# Patient Record
Sex: Female | Born: 1965 | Race: White | Hispanic: No | Marital: Married | Smoking: Never smoker
Health system: Southern US, Community
[De-identification: ages and names within clinical notes are randomized; demographics above are authoritative.]

## PROBLEM LIST (undated history)

## (undated) DIAGNOSIS — D649 Anemia, unspecified: Secondary | ICD-10-CM

## (undated) DIAGNOSIS — R519 Headache, unspecified: Secondary | ICD-10-CM

## (undated) DIAGNOSIS — E785 Hyperlipidemia, unspecified: Secondary | ICD-10-CM

## (undated) DIAGNOSIS — R51 Headache: Secondary | ICD-10-CM

## (undated) DIAGNOSIS — Z9289 Personal history of other medical treatment: Secondary | ICD-10-CM

## (undated) HISTORY — PX: WISDOM TOOTH EXTRACTION: SHX21

## (undated) HISTORY — DX: Anemia, unspecified: D64.9

---

## 2004-06-18 ENCOUNTER — Other Ambulatory Visit: Admission: RE | Admit: 2004-06-18 | Discharge: 2004-06-18 | Payer: Self-pay | Admitting: Family Medicine

## 2006-07-27 ENCOUNTER — Other Ambulatory Visit: Admission: RE | Admit: 2006-07-27 | Discharge: 2006-07-27 | Payer: Self-pay | Admitting: Family Medicine

## 2008-07-02 ENCOUNTER — Encounter: Admission: RE | Admit: 2008-07-02 | Discharge: 2008-07-02 | Payer: Self-pay | Admitting: Family Medicine

## 2010-11-03 ENCOUNTER — Other Ambulatory Visit: Payer: Self-pay | Admitting: Internal Medicine

## 2010-11-03 DIAGNOSIS — Z1231 Encounter for screening mammogram for malignant neoplasm of breast: Secondary | ICD-10-CM

## 2010-11-17 ENCOUNTER — Ambulatory Visit
Admission: RE | Admit: 2010-11-17 | Discharge: 2010-11-17 | Disposition: A | Discharge status: Home / Self Care | Payer: BC Managed Care – PPO | Source: Ambulatory Visit | Attending: Internal Medicine | Admitting: Internal Medicine

## 2010-11-17 DIAGNOSIS — Z1231 Encounter for screening mammogram for malignant neoplasm of breast: Secondary | ICD-10-CM

## 2012-02-03 ENCOUNTER — Other Ambulatory Visit: Payer: Self-pay | Admitting: Family Medicine

## 2012-02-03 ENCOUNTER — Other Ambulatory Visit (HOSPITAL_COMMUNITY)
Admission: RE | Admit: 2012-02-03 | Discharge: 2012-02-03 | Disposition: A | Discharge status: Home / Self Care | Payer: BC Managed Care – PPO | Source: Ambulatory Visit | Attending: Family Medicine | Admitting: Family Medicine

## 2012-02-03 DIAGNOSIS — Z01419 Encounter for gynecological examination (general) (routine) without abnormal findings: Secondary | ICD-10-CM | POA: Insufficient documentation

## 2014-05-30 ENCOUNTER — Other Ambulatory Visit: Payer: Self-pay | Admitting: Family Medicine

## 2014-05-30 DIAGNOSIS — Z1231 Encounter for screening mammogram for malignant neoplasm of breast: Secondary | ICD-10-CM

## 2015-11-13 ENCOUNTER — Other Ambulatory Visit: Payer: Self-pay

## 2015-11-13 DIAGNOSIS — Z1231 Encounter for screening mammogram for malignant neoplasm of breast: Secondary | ICD-10-CM

## 2015-12-31 ENCOUNTER — Ambulatory Visit
Admission: RE | Admit: 2015-12-31 | Discharge: 2015-12-31 | Disposition: A | Discharge status: Home / Self Care | Payer: Managed Care, Other (non HMO) | Source: Ambulatory Visit

## 2015-12-31 DIAGNOSIS — Z1231 Encounter for screening mammogram for malignant neoplasm of breast: Secondary | ICD-10-CM

## 2016-04-12 ENCOUNTER — Other Ambulatory Visit: Payer: Self-pay | Admitting: Family Medicine

## 2016-04-12 ENCOUNTER — Other Ambulatory Visit (HOSPITAL_COMMUNITY)
Admission: RE | Admit: 2016-04-12 | Discharge: 2016-04-12 | Disposition: A | Discharge status: Home / Self Care | Payer: Managed Care, Other (non HMO) | Source: Ambulatory Visit | Attending: Family Medicine | Admitting: Family Medicine

## 2016-04-12 DIAGNOSIS — Z01411 Encounter for gynecological examination (general) (routine) with abnormal findings: Secondary | ICD-10-CM | POA: Diagnosis not present

## 2016-04-14 LAB — CYTOLOGY - PAP

## 2017-03-02 IMAGING — MG MM SCREENING BREAST TOMO BILATERAL
8 of 12 series · 8 of 28 positions shown · non-contrast
Comparison: Previous exam(s).

CLINICAL DATA: Screening.

EXAM:
2D DIGITAL SCREENING BILATERAL MAMMOGRAM WITH CAD AND ADJUNCT TOMO

[R CC synth-2D]
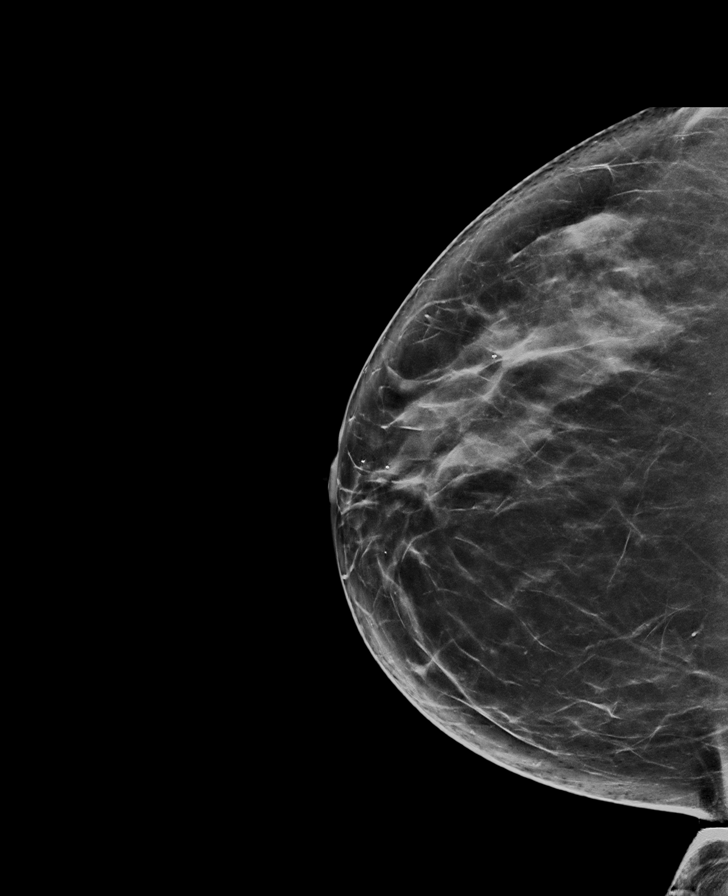

[L CC synth-2D]
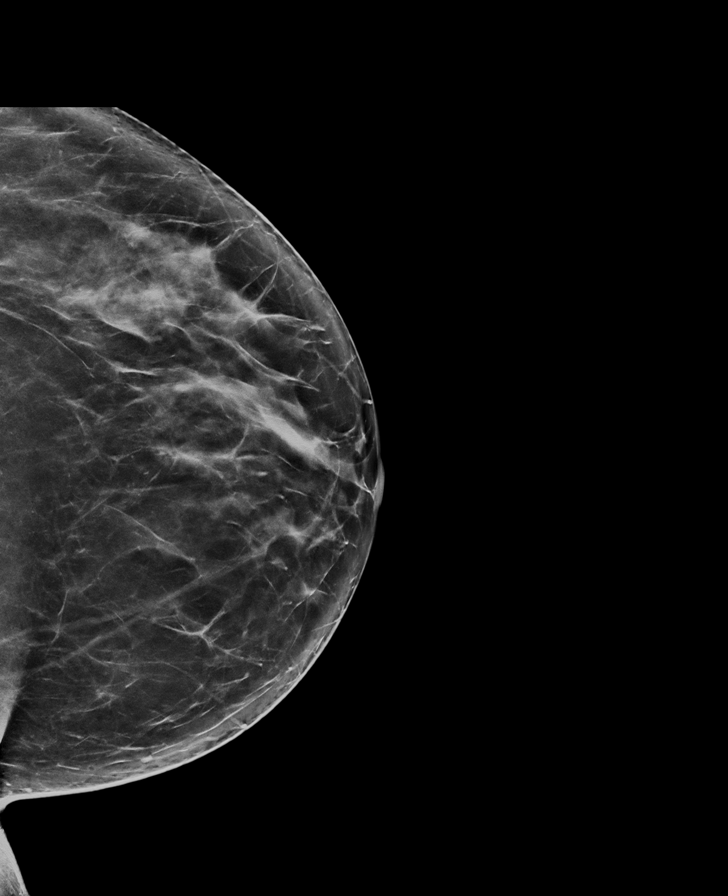

[L MLO]
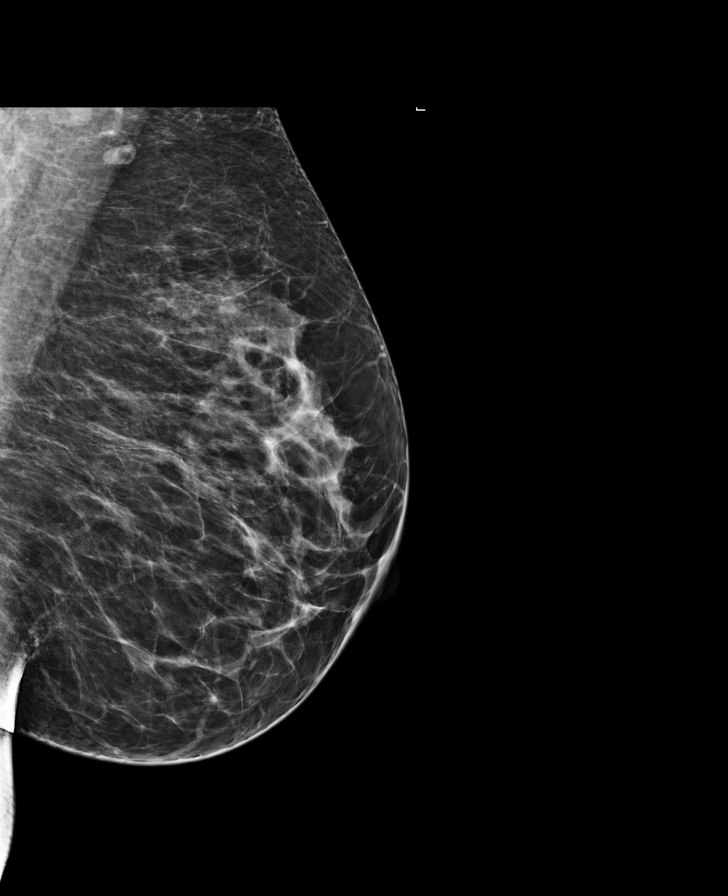

[R MLO synth-2D]
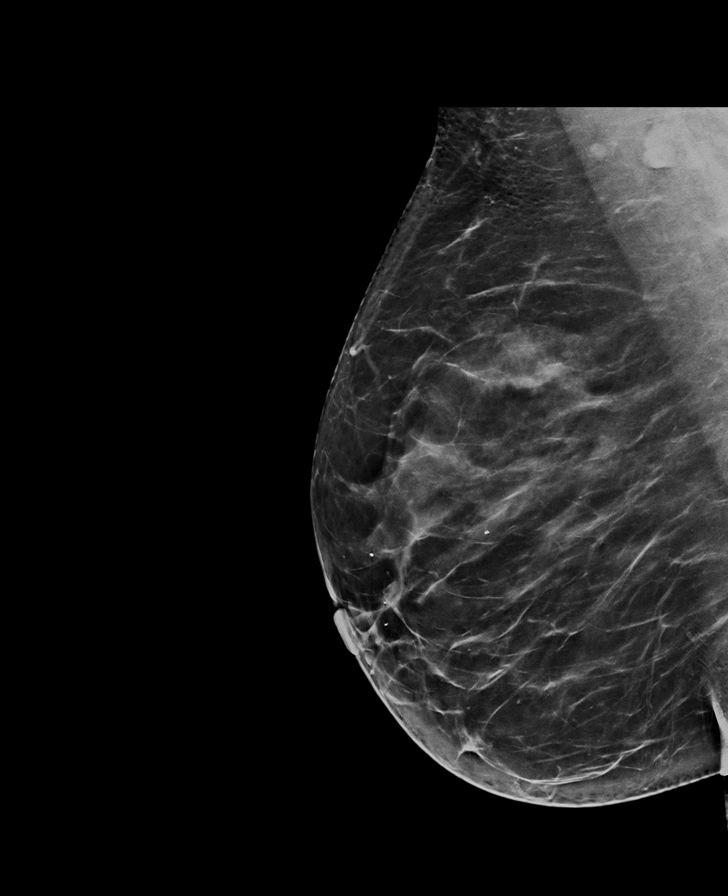

[R CC]
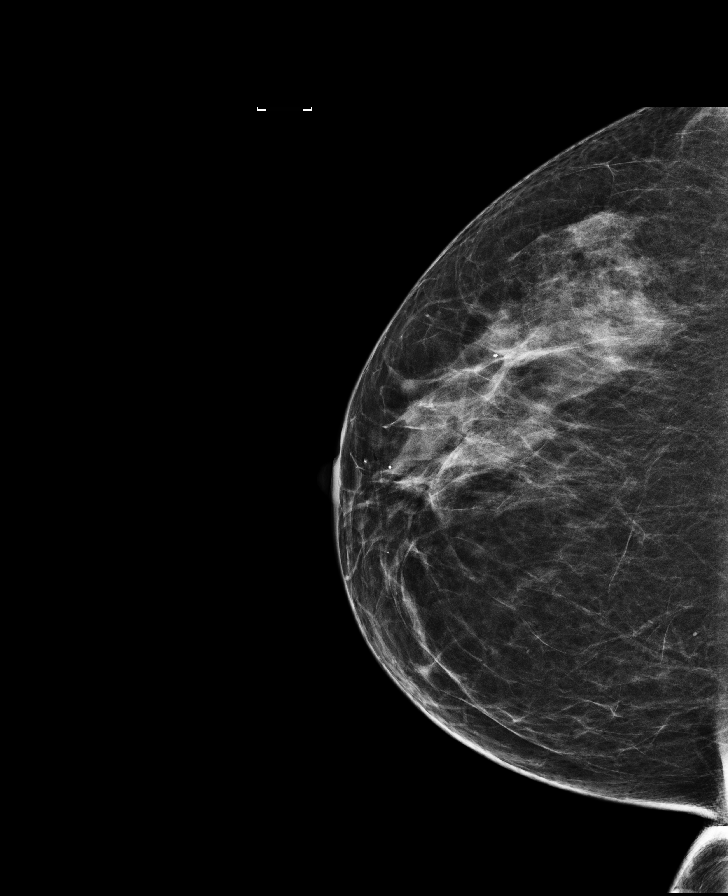

[L CC]
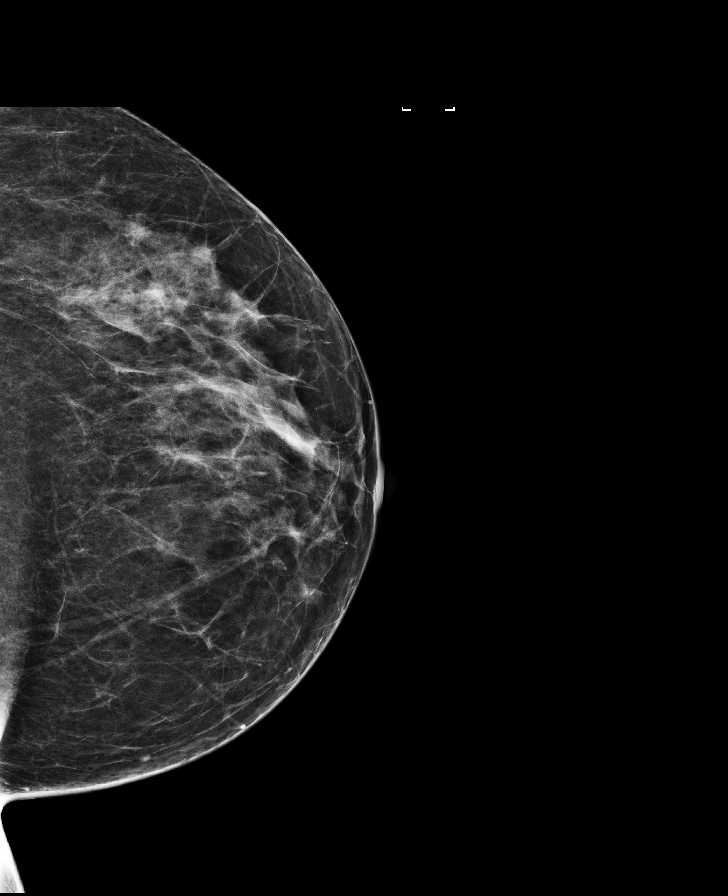

[R MLO]
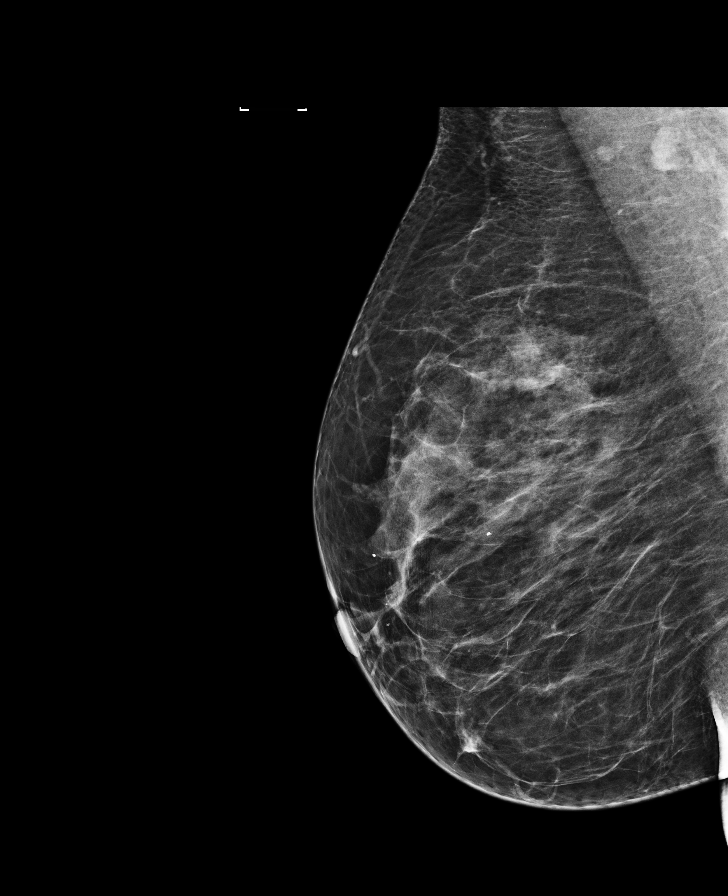

[L MLO synth-2D]
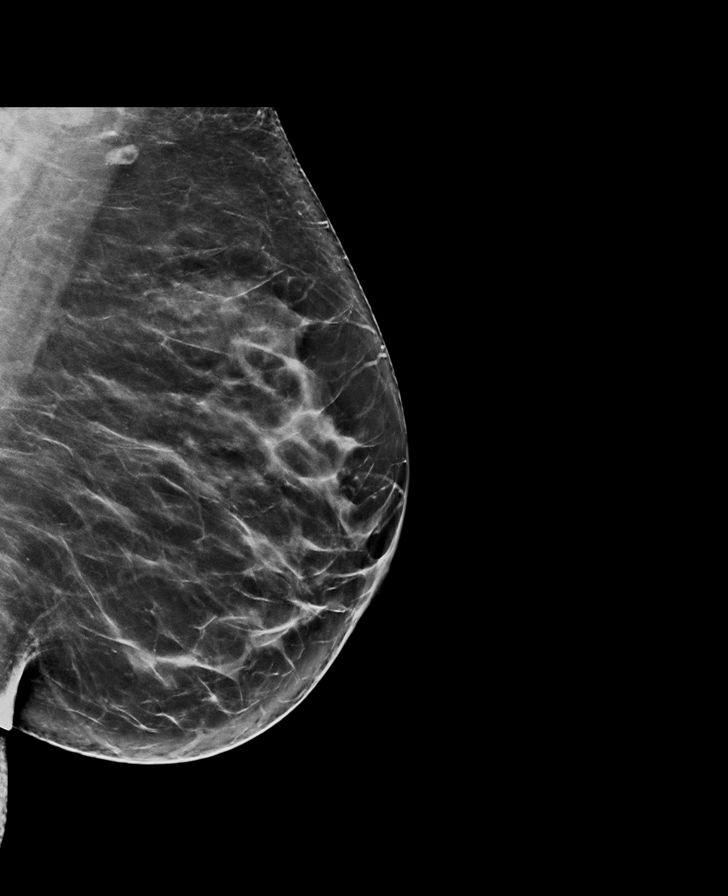

[8 of 28 positions shown; findings below may reference images not displayed]

ACR Breast Density Category b: There are scattered areas of
fibroglandular density.
FINDINGS: There are no findings suspicious for malignancy. Images were
processed with CAD.
IMPRESSION: No mammographic evidence of malignancy. A result letter of this
screening mammogram will be mailed directly to the patient.

RECOMMENDATION:
Screening mammogram in one year. (Code:97-6-RS4)

BI-RADS CATEGORY  1: Negative.

## 2018-04-28 ENCOUNTER — Observation Stay (HOSPITAL_COMMUNITY)
Admission: AD | Admit: 2018-04-28 | Discharge: 2018-04-28 | Disposition: A | Discharge status: Home / Self Care | Payer: Managed Care, Other (non HMO) | Source: Ambulatory Visit | Attending: Obstetrics and Gynecology | Admitting: Obstetrics and Gynecology

## 2018-04-28 ENCOUNTER — Other Ambulatory Visit: Payer: Self-pay

## 2018-04-28 ENCOUNTER — Encounter (HOSPITAL_COMMUNITY): Payer: Self-pay

## 2018-04-28 DIAGNOSIS — D591 Other autoimmune hemolytic anemias: Secondary | ICD-10-CM | POA: Diagnosis not present

## 2018-04-28 DIAGNOSIS — D649 Anemia, unspecified: Secondary | ICD-10-CM | POA: Diagnosis present

## 2018-04-28 DIAGNOSIS — N938 Other specified abnormal uterine and vaginal bleeding: Secondary | ICD-10-CM | POA: Diagnosis present

## 2018-04-28 DIAGNOSIS — Z9289 Personal history of other medical treatment: Secondary | ICD-10-CM

## 2018-04-28 HISTORY — DX: Personal history of other medical treatment: Z92.89

## 2018-04-28 LAB — BASIC METABOLIC PANEL
Anion gap: 11 (ref 5–15)
BUN: 10 mg/dL (ref 6–20)
CO2: 20 mmol/L — ABNORMAL LOW (ref 22–32)
Calcium: 9.2 mg/dL (ref 8.9–10.3)
Chloride: 107 mmol/L (ref 98–111)
Creatinine, Ser: 0.75 mg/dL (ref 0.44–1.00)
GFR calc Af Amer: >60 mL/min (ref >60)
GLUCOSE: 178 mg/dL — AB (ref 70–99)
POTASSIUM: 4.2 mmol/L (ref 3.5–5.1)
Sodium: 138 mmol/L (ref 135–145)

## 2018-04-28 LAB — PREPARE RBC (CROSSMATCH)

## 2018-04-28 LAB — ABO/RH: ABO/RH(D): O POS

## 2018-04-28 MED ORDER — DIPHENHYDRAMINE HCL 25 MG PO CAPS
25.0000 mg | ORAL_CAPSULE | Freq: Once | ORAL | Status: AC
  Administered 2018-04-28: 25 mg via ORAL
  Filled 2018-04-28: qty 1

## 2018-04-28 MED ORDER — ACETAMINOPHEN 325 MG PO TABS
650.0000 mg | ORAL_TABLET | Freq: Once | ORAL | Status: AC
  Administered 2018-04-28: 650 mg via ORAL
  Filled 2018-04-28: qty 2

## 2018-04-28 MED ORDER — PRENATAL MULTIVITAMIN CH: 1.0000 | ORAL_TABLET | Freq: Every day | ORAL | Status: DC

## 2018-04-28 MED ORDER — SODIUM CHLORIDE 0.9% IV SOLUTION
Freq: Once | INTRAVENOUS | Status: AC
  Administered 2018-04-28: 10:00:00 via INTRAVENOUS

## 2018-04-28 NOTE — H&P (Addendum)
Veronica Nichols is an 52 y.o. female. Presenting with anemia of ~6 after episode of heavy vaginal bleeding.  Found to have a 4cm cervical mass on exam.   Pertinent Gynecological History: Menses: flow is excessive with use of 20 pads or tampons on heaviest days Bleeding: dysfunctional uterine bleeding Contraception: none DES exposure: denies Blood transfusions: none Sexually transmitted diseases: no past history Previous GYN Procedures: none  Last mammogram: normal Date: 2016 Last pap: normal Date: 2016 OB History: G2, P2002   Menstrual History: Menarche age: n/a No LMP recorded.    History reviewed. No pertinent past medical history.  History reviewed. No pertinent surgical history.  History reviewed. No pertinent family history.  Social History:  reports that she has never smoked. She has never used smokeless tobacco. Her alcohol and drug histories are not on file.  Allergies:  Allergies  Allergen Reactions  . Sulfa Antibiotics Hives    Medications Prior to Admission  Medication Sig Dispense Refill Last Dose  . ferrous sulfate 325 (65 FE) MG tablet Take 325 mg by mouth 2 (two) times daily.   04/27/2018 at Unknown time  . norethindrone (AYGESTIN) 5 MG tablet    04/27/2018 at Unknown time    ROS  Blood pressure (!) 95/51, pulse 81, temperature 99.3 F (37.4 C), temperature source Oral, resp. rate 18, height 5\' 2"  (1.575 m), weight 69.4 kg, SpO2 98 %. Physical Exam  Gen NAD Abd soft nontender nondistended GYN b/l external vulva normal, vagina w/out masses. Cervix 5cm dilated filled with large mass. Uterus enlarged (~15 week size). Adnexa non-palapable and nontender Ext no edema b/l  Results for orders placed or performed during the hospital encounter of 04/28/18 (from the past 24 hour(s))  Basic metabolic panel     Status: Abnormal   Collection Time: 04/28/18  9:04 AM  Result Value Ref Range   Sodium 138 135 - 145 mmol/L   Potassium 4.2 3.5 - 5.1 mmol/L   Chloride 107 98  - 111 mmol/L   CO2 20 (L) 22 - 32 mmol/L   Glucose, Bld 178 (H) 70 - 99 mg/dL   BUN 10 6 - 20 mg/dL   Creatinine, Ser 0.75 0.44 - 1.00 mg/dL   Calcium 9.2 8.9 - 10.3 mg/dL   GFR calc non Af Amer >60 >60 mL/min   GFR calc Af Amer >60 >60 mL/min   Anion gap 11 5 - 15  Prepare RBC     Status: None   Collection Time: 04/28/18  9:04 AM  Result Value Ref Range   Order Confirmation      ORDER PROCESSED BY BLOOD BANK Performed at [facility], [address], [place], [place] [address]   Type and screen [place]     Status: None (Preliminary result)   Collection Time: 04/28/18  9:04 AM  Result Value Ref Range   ABO/RH(D) O POS    Antibody Screen NEG    Sample Expiration 05/01/2018    Unit Number [id number]    Blood Component Type RED CELLS,LR    Unit division 00    Status of Unit ISSUED    Transfusion Status OK TO TRANSFUSE    Crossmatch Result Compatible    Unit Number [id number]    Blood Component Type RED CELLS,LR    Unit division 00    Status of Unit ALLOCATED    Transfusion Status OK TO TRANSFUSE    Crossmatch Result Compatible    Unit Number [id number]    Blood  Component Type RED CELLS,LR    Unit division 00    Status of Unit ISSUED    Transfusion Status OK TO TRANSFUSE    Crossmatch Result      Compatible Performed at [facility], [address] [place], [place] [address]   ABO/Rh     Status: None   Collection Time: 04/28/18  9:04 AM  Result Value Ref Range   ABO/RH(D)      O POS Performed at [facility], [address] [place], [place] [address]    Imaging TVUS in office: 14x8.4x8.8cm uterus with fibroids  Subserosal: 3, 2, and 2cm  Intramural: 3 and 1.6cm  4.3cm cervical fibroid (?)  EMS 2.3cm R Ovary 3cm simple cyst L Ovary 4.8cm simple cyst Tubular structure in L adnexa (hydrosalpinx?) 4x2.3cm No results found.  Assessment/Plan: 23JS [id number] presenting with symptomatic anemia.  # Anemia - likely s/s  heavy VB from cervical mass Plan for 3u pRBC - risks discussed . Consents to transfusions. Continue Iron upon dc CBC in office tuesday  # Fibroid uterus/Cervical mass - in office biopsy of cervical mass planned. Highly suspect fibroid. EMB also planned in office. Fu pap smear.  Options reviewed if path c/w fibroid - including observation, vaginal MMY, Lupron followed by hyst, and hyst and pt opts lupron x3 months followed by hyst. Likely abdominal but hope to shrink utereus enough for minimally invasive approach  # vaginal bleeding - none current s/p heavy bleed last month soaking tampons q 15 min. On progesterone daily to keep EMS thin in preparation for surgical management  # Ovarian cysts - bilateral, both <5cm. Dw patient repeat imaging six weeks.    Veronica Nichols Veronica Nichols 04/28/2018, 3:24 PM

## 2018-04-28 NOTE — Progress Notes (Signed)
Medications discussed; Teach Back Method Used; Follow-up care reviewed; Discharge instructions reviewed; Admission discussed; Patient/caregiver verbalized understanding; Pain management discussed; Prescriptions reviewed.

## 2018-04-30 LAB — BPAM RBC
BLOOD PRODUCT EXPIRATION DATE: 201909152359
Blood Product Expiration Date: 201909152359
Blood Product Expiration Date: 201909152359
ISSUE DATE / TIME: 201908161209
ISSUE DATE / TIME: 201908161351
ISSUE DATE / TIME: 201908161533
UNIT TYPE AND RH: 5100
UNIT TYPE AND RH: 5100
Unit Type and Rh: 5100

## 2018-04-30 LAB — TYPE AND SCREEN
ABO/RH(D): O POS
Antibody Screen: NEGATIVE
UNIT DIVISION: 0
Unit division: 0
Unit division: 0

## 2018-05-01 NOTE — Discharge Summary (Signed)
Physician Discharge Summary  Patient ID: Veronica Nichols MRN: [id number] DOB/AGE: Jun 07, 1966 52 y.o.  Admit date: 04/28/2018 Discharge date: 05/01/2018  Admission Diagnoses:  Discharge Diagnoses:  Active Problems:   Symptomatic anemia   Discharged Condition: good  Hospital Course: 52 yo p/w symptomatic anemia. She received 3u prbc with repeat of hgb planned in office     Consults: None  Significant Diagnostic Studies: none   Treatments: blood  Discharge Exam: Blood pressure 121/77, pulse 79, temperature 99.2 F (37.3 C), temperature source Oral, resp. rate 18, height 5\' 2"  (1.575 m), weight 69.4 kg, SpO2 98 %. General appearance: alert, cooperative and appears stated age Resp: clear to auscultation bilaterally Cardio: regular rate and rhythm, S1, S2 normal, no murmur, click, rub or gallop GI: soft, non-tender; bowel sounds normal; no masses,  no organomegaly Pelvic: cervix normal in appearance, external genitalia normal, no adnexal masses or tenderness, no cervical motion tenderness, rectovaginal septum normal, uterus normal size, shape, and consistency and vagina normal without discharge  Disposition:   Discharge Instructions    Call MD for:   Complete by:  As directed    Heavy vaginal bleeding or abnormal vaginal discharge   Call MD for:  difficulty breathing, headache or visual disturbances   Complete by:  As directed    Call MD for:  persistant nausea and vomiting   Complete by:  As directed    Call MD for:  redness, tenderness, or signs of infection (pain, swelling, redness, odor or green/yellow discharge around incision site)   Complete by:  As directed    Call MD for:  severe uncontrolled pain   Complete by:  As directed    Call MD for:  temperature >100.4   Complete by:  As directed    Diet general   Complete by:  As directed    Driving Restrictions   Complete by:  As directed    Do not drive until you are off narcotic pain medications and you feel like you  can react in an emergency.   Increase activity slowly   Complete by:  As directed    Sexual Activity Restrictions   Complete by:  As directed    Nothing in the vagina x 2 to 6 weeks. We will discuss at clinic visit.     Allergies as of 04/28/2018      Reactions   Sulfa Antibiotics Hives      Medication List    TAKE these medications   ferrous sulfate 325 (65 FE) MG tablet Take 325 mg by mouth 2 (two) times daily.   norethindrone 5 MG tablet Commonly known as:  AYGESTIN        Signed: Tyson Dense 05/01/2018, 3:01 AM

## 2018-05-02 NOTE — Progress Notes (Signed)
Patient has never been seen here. Patient has orders from 2005 with your authorization. Patient has not established with you as an active patient at [facility].

## 2018-08-24 ENCOUNTER — Telehealth: Payer: Self-pay | Admitting: *Deleted

## 2018-08-24 NOTE — Telephone Encounter (Signed)
Attempted to contact the patient regarding her referral. Unable to leave message, voicemail full.

## 2018-08-25 ENCOUNTER — Telehealth: Payer: Self-pay | Admitting: *Deleted

## 2018-08-25 NOTE — Telephone Encounter (Signed)
Called and spoke with the patient, scheduled the new patient appt for 12/17

## 2018-08-30 NOTE — Progress Notes (Signed)
Consult Note: Gyn-Onc  Consult was requested by Dr.Leger for the evaluation of Veronica Nichols 52 y.o. female  CC: Left adnexal mass, abnormal uterine bleeding  Assessment/Plan:  Ms. Veronica Nichols is a 52 y.o. premenopausal woman with abnormal uterine bleeding, and a left adnexal mass slowly growing in size.  The left adnexal mass likely represents a benign neoplasm.  I concur with the plan for LSO.  The CA 125 is 87 but surprisingly her FSH is 4.7. She denies bloating has a great appetite and and feels well, We discussed that if intra-operative or post operative findings indicate a malignancy, that Gyn Onc  will quickly accommodate a visit in the office for treatment planning.  We also discussed that often, additional necessary Gyn Onc surgical procedure if indicated will be attempted in a minimally invasive fashion on a different operative date.Veronica Nichols has discussed definitive surgical management of her abnormal uterine bleeding with Dr. Royston Nichols and is scheduled on 10/04/2018 for TAH BS possible LO.  We would recommend collection of pelvic washings upon entry into the abdomen and palpation of the upper abdomen.  I discussed endometrial ablation or progesterone IUD but Veronica Nichols remains committed to surgical removal of the uterus.  HPI: Ms. Veronica Nichols is a 52 y.o.  With left adnexal mass, slowly increasing in size.    Pelvic ultrasound April 26, 2018 2 densities were appreciated within the endometrial cavity which were possible polyps the uterine length was 14.1 cm with endometrial thickness of 2.3 cm subserosal intramural and cervical myomas were appreciated right ovary was noted to have a 2.9 x 1.6 cm simple appearing cyst left ovary was noted to have a 4.8 x 3.6 cm simple appearing cyst and also a 4.0 x 2.3 cm elongated tubular structure  Pelvic ultrasound June 22, 2018 uterus measuring 9.62 cm with endometrial thickness of 1 cm.  Intramural and subserosal myomas identified left ovary was noted  to have a 5.2 x 3.7 x 4.7 cm cystic mass with layered internal echoes no flow seen,  Pelvic ultrasound August 15, 2018 uterus measuring 11.2 cm in length endometrial stripe 1.9 cm.  Intramural subserosal and cervical myomas were appreciated.  Right adnexa measures 2.2 x 1.5 cm was simple cyst. Left adnexa measures 7.1 x 4.3 x 5.3 cm mass seen with lower internal echoes.  Labs collected on August 14, 2018 returned as follows :Ca1 25  87, TSH 4.9 in the premenopausal range.  Inhibin A less than 1 inhibin B 152, AFP1.8  A concurrent issue is the heavy vaginal bleeding for1 year.  IN 04/2018 Hgb was 6.4 and required transfusion.  EMB indicated secretory endometrium without dysplasia. Veronica Nichols is currently on provera 5 mg daily.    Dr. Elaina Pattee plan is for total abdominal hysterectomy bilateral salpingectomy ovarian cystectomy but more likely left salpingo-oophorectomy.  The plan to which the patient has agreed is that if carcinoma is final is identified on final pathology then she will be re-counseled by GYN oncology regarding the appropriate next steps  Review of Systems:  Constitutional  Feels well Cardiovascular  No chest pain, shortness of breath, or edema  Pulmonary  No cough or wheeze.  Gastro Intestinal  No nausea, vomitting, or diarrhoea. No bright red blood per rectum, no abdominal pain, change in bowel movement, or constipation.  Genito Urinary  No frequency, urgency, dysuria, intermittent heavy vaginal bleeding.  Very heavy after last ultrasound. Musculo Skeletal  No myalgia, arthralgia, joint swelling or pain  Neurologic  No weakness, numbness, change in gait,  Psychology  No depression, anxiety, insomnia.    Current Meds:  Outpatient Encounter Medications as of 08/31/2018  Medication Sig  . ferrous sulfate 325 (65 FE) MG tablet Take 325 mg by mouth 2 (two) times daily.  . norethindrone (AYGESTIN) 5 MG tablet    No facility-administered encounter medications on file as  of 08/31/2018.     Allergy:  Allergies  Allergen Reactions  . Sulfa Antibiotics Hives    Social Hx:   Social History   Socioeconomic History  . Marital status: Married    Spouse name: Not on file  . Number of children: Not on file  . Years of education: Not on file  . Highest education level: Not on file  Occupational History  . Not on file  Social Needs  . Financial resource strain: Not on file  . Food insecurity:    Worry: Not on file    Inability: Not on file  . Transportation needs:    Medical: Not on file    Non-medical: Not on file  Tobacco Use  . Smoking status: Never Smoker  . Smokeless tobacco: Never Used  Substance and Sexual Activity  . Alcohol use: Not on file  . Drug use: Not on file  . Sexual activity: Not on file  Lifestyle  . Physical activity:    Days per week: Not on file    Minutes per session: Not on file  . Stress: Not on file  Relationships  . Social connections:    Talks on phone: Not on file    Gets together: Not on file    Attends religious service: Not on file    Active member of club or organization: Not on file    Attends meetings of clubs or organizations: Not on file    Relationship status: Not on file  . Intimate partner violence:    Fear of current or ex partner: Not on file    Emotionally abused: Not on file    Physically abused: Not on file    Forced sexual activity: Not on file  Other Topics Concern  . Not on file  Social History Narrative  . Not on file    Past Surgical Hx: No past surgical history on file.  Past Medical Hx: No past medical history on file.  Past Gynecological History: Pap August 2019- HPV high risk none detected Family Hx: MGM breast cancer dx 70's  Vitals:  Blood pressure (!) 160/86, pulse 98, temperature 97.8 F (36.6 C), temperature source Oral, resp. rate 18, height 5\' 2"  (1.575 m), weight 156 lb (70.8 kg).  Physical Exam: WD in NAD Neck  Supple NROM,  Psychiatry  Alert and oriented  appropriate mood affect speech and reasoning. Abdomen  Normoactive bowel sounds, abdomen soft, non-tender. No ascites, no palpable omental cake. No tenderness or guarding.    Back No CVA tenderness   Janie Morning, MD, PhD

## 2018-08-31 ENCOUNTER — Encounter: Payer: Self-pay | Admitting: Gynecologic Oncology

## 2018-08-31 ENCOUNTER — Inpatient Hospital Stay: Payer: Managed Care, Other (non HMO) | Attending: Gynecologic Oncology | Admitting: Gynecologic Oncology

## 2018-08-31 VITALS — BP 140/82 | HR 98 | Temp 97.8°F | Resp 18 | Ht 62.0 in | Wt 156.0 lb

## 2018-08-31 DIAGNOSIS — N9489 Other specified conditions associated with female genital organs and menstrual cycle: Secondary | ICD-10-CM

## 2018-08-31 DIAGNOSIS — N939 Abnormal uterine and vaginal bleeding, unspecified: Secondary | ICD-10-CM | POA: Diagnosis not present

## 2018-08-31 DIAGNOSIS — D398 Neoplasm of uncertain behavior of other specified female genital organs: Secondary | ICD-10-CM | POA: Diagnosis present

## 2018-08-31 NOTE — Patient Instructions (Signed)
Please call our office for any questions or concerns

## 2018-09-15 NOTE — H&P (Addendum)
Veronica Nichols is an 53 y.o. female. Presenting for scheduled surgery. She originally presented with heavy vaginal bleeding requiring blood transfusion and was found to have a large cervical mass. This was biopsied and found to be a cervical fibroid vs polyp. US showed bilateral ovarian cysts. She was placed on Lupron in hopes to shrink uterus enough for minimally invasive approach but uterus then became fixed in pelvis and ovarian mass enlarged. Tumor markers were neg except mild elevation in CA-125. GYN ONC was consulted and they felt mass was benign. She desires definitive management with hysterectomy and cystectomy vs oophorectomy of left side.   Pertinent Gynecological History: Menses: flow is excessive with use of 12 pads or tampons on heaviest days Bleeding: intermenstrual bleeding and dysfunctional uterine bleeding Contraception: none DES exposure: denies Blood transfusions: 3u pRBC 04/2018 Sexually transmitted diseases: no past history Previous GYN Procedures: biopsy of cervical mass, benign  Last mammogram: normal Date: 2016 Last pap: normal Date: 2019 OB History: G2, P2   Menstrual History: No LMP recorded.    Past Medical History:  Diagnosis Date  . Anemia     No past surgical history on file.  Family History  Problem Relation Age of Onset  . Breast cancer Mother   . Diabetes Father   . Heart disease Father     Social History:  reports that she has never smoked. She has never used smokeless tobacco. She reports current alcohol use of about 2.0 standard drinks of alcohol per week. She reports that she does not use drugs.  Allergies:   Allergies  Allergen Reactions  . Sulfa Antibiotics Hives    No medications prior to admission.    ROS  There were no vitals taken for this visit. Physical Exam  Gen: well appearing, NAD CV: Reg rate Pulm: NWOB Abd: soft, nondistended, nontender, no masses GYN: uterus 12 week size, enlarged, fibroid. Endocervical mass ~4cm  distorting cervical os, no adnexa ttp/CMT Ext: No edema b/l   No results found for this or any previous visit (from the past 24 hour(s)).  No results found.   TVUS 14 -->9cm ut w/5cm L ovarian vs tubal mass--> 11cm ut + 7cm L adnexal mass (ovarian? endometrioma?) + stable 4cm cervical fibroid. CA125 elevated    Assessment/Plan: 53 yo presenting for scheduled TAH, BS, pelvic washings, cystoscopy, and possible ovarian cystectomy vs left sided oophorectomy for hx of enlarged fibroid uterus with AUB and enlarging left sided ovarian cyst. Risks discussed including infection, bleeding, damage to surrounding structures, need for additional procedures, postoperative DVT, future prolapse, the possibility of cancer being present and requiring additional procedures, and the high likelihood of removal of at least one ovary. All questions answered. Consent signed in office. Gb improved to 12 on progesterone. Patient saw GYN ONC and they are aware of her in case any path comes back cancer.    Colin Benton  09/15/2018, 2:14 PM   No updates to above H&P. Patient arrived NPO and was consented in PACU. Risks again discussed, all questions answered, and consent signed. Proceed with above surgery.    Lucillie Garfinkel MD

## 2018-09-22 NOTE — Patient Instructions (Addendum)
Your procedure is scheduled on:  Wednesday, 10/04/2018  Enter through the Main Entrance of [facility] at: 6 am  Pick up the phone at the desk and dial [phone].  Call this number if you have problems the morning of surgery: [phone].  Remember: Do NOT eat food or Do NOT drink clear liquids (including water) after midnight Tuesday.  Take these medicines the morning of surgery with a SIP OF WATER: crestor  Brush your teeth on the day of surgery.  Stop herbal medications, vitamin supplements, Ibuprofen/NSAIDS 1 week prior to surgery- Wednesday 09/27/2018.  May use Tylenol.  Do NOT wear jewelry (body piercing), metal hair clips/bobby pins, make-up, or nail polish. Do NOT wear lotions, powders, or perfumes.  You may wear deoderant. Do NOT shave for 48 hours prior to surgery. Do NOT bring valuables to the hospital.  Leave suitcase in car.  After surgery it may be brought to your room.  For patients admitted to the hospital, checkout time is 11:00 AM the day of discharge. Have a responsible adult drive you home and stay with you for 24 hours after your procedure.  Home with Husband Veronica Nichols cell [phone].

## 2018-09-25 ENCOUNTER — Other Ambulatory Visit: Payer: Self-pay

## 2018-09-25 ENCOUNTER — Encounter (HOSPITAL_COMMUNITY): Payer: Self-pay | Admitting: *Deleted

## 2018-09-25 ENCOUNTER — Encounter (HOSPITAL_COMMUNITY)
Admission: RE | Admit: 2018-09-25 | Discharge: 2018-09-25 | Disposition: A | Discharge status: Home / Self Care | Payer: 59 | Source: Ambulatory Visit | Attending: Obstetrics and Gynecology | Admitting: Obstetrics and Gynecology

## 2018-09-25 DIAGNOSIS — Z01812 Encounter for preprocedural laboratory examination: Secondary | ICD-10-CM | POA: Diagnosis present

## 2018-09-25 HISTORY — DX: Personal history of other medical treatment: Z92.89

## 2018-09-25 HISTORY — DX: Headache: R51

## 2018-09-25 HISTORY — DX: Hyperlipidemia, unspecified: E78.5

## 2018-09-25 HISTORY — DX: Headache, unspecified: R51.9

## 2018-09-25 LAB — CBC
HCT: 38.8 % (ref 36.0–46.0)
Hemoglobin: 12.8 g/dL (ref 12.0–15.0)
MCH: 31.7 pg (ref 26.0–34.0)
MCHC: 33 g/dL (ref 30.0–36.0)
MCV: 96 fL (ref 80.0–100.0)
Platelets: 298 10*3/uL (ref 150–400)
RBC: 4.04 MIL/uL (ref 3.87–5.11)
RDW: 12.8 % (ref 11.5–15.5)
WBC: 9.4 10*3/uL (ref 4.0–10.5)
nRBC: 0 % (ref 0.0–0.2)

## 2018-09-25 LAB — TYPE AND SCREEN
ABO/RH(D): O POS
ANTIBODY SCREEN: NEGATIVE

## 2018-09-25 NOTE — Pre-Procedure Instructions (Signed)
SDS BB History Log given to lab for patient's previous blood transfusion in 04/2018 at [facility].

## 2018-09-29 NOTE — Anesthesia Preprocedure Evaluation (Addendum)
Anesthesia Evaluation  Patient identified by MRN, date of birth, ID band Patient awake    Reviewed: Allergy & Precautions, NPO status , Patient's Chart, lab work & pertinent test results  History of Anesthesia Complications Negative for: history of anesthetic complications  Airway Mallampati: II  TM Distance: >3 FB Neck ROM: Full    Dental  (+) Teeth Intact, Dental Advisory Given   Pulmonary neg pulmonary ROS,    Pulmonary exam normal breath sounds clear to auscultation       Cardiovascular negative cardio ROS Normal cardiovascular exam Rhythm:Regular Rate:Normal     Neuro/Psych negative neurological ROS     GI/Hepatic negative GI ROS, Neg liver ROS,   Endo/Other  negative endocrine ROS  Renal/GU negative Renal ROS     Musculoskeletal negative musculoskeletal ROS (+)   Abdominal   Peds  Hematology negative hematology ROS (+)   Anesthesia Other Findings Day of surgery medications reviewed with the patient.  Reproductive/Obstetrics Uterine fibroids, left ovarian mass                            Anesthesia Physical Anesthesia Plan  ASA: II  Anesthesia Plan: General   Post-op Pain Management:    Induction: Intravenous  PONV Risk Score and Plan: Treatment may vary due to age or medical condition, Ondansetron, Dexamethasone and Midazolam  Airway Management Planned: Oral ETT  Additional Equipment:   Intra-op Plan:   Post-operative Plan: Extubation in OR  Informed Consent: I have reviewed the patients History and Physical, chart, labs and discussed the procedure including the risks, benefits and alternatives for the proposed anesthesia with the patient or authorized representative who has indicated his/her understanding and acceptance.     Dental advisory given  Plan Discussed with: CRNA  Anesthesia Plan Comments:        Anesthesia Quick Evaluation

## 2018-10-04 ENCOUNTER — Encounter (HOSPITAL_COMMUNITY): Payer: Self-pay

## 2018-10-04 ENCOUNTER — Inpatient Hospital Stay (HOSPITAL_COMMUNITY): Payer: No Typology Code available for payment source | Admitting: Anesthesiology

## 2018-10-04 ENCOUNTER — Encounter (HOSPITAL_COMMUNITY)
Admission: RE | Disposition: A | Discharge status: Home / Self Care | Payer: Self-pay | Source: Home / Self Care | Attending: Obstetrics and Gynecology

## 2018-10-04 ENCOUNTER — Other Ambulatory Visit: Payer: Self-pay

## 2018-10-04 ENCOUNTER — Inpatient Hospital Stay (HOSPITAL_COMMUNITY)
Admission: RE | Admit: 2018-10-04 | Discharge: 2018-10-06 | DRG: 743 | Disposition: A | Discharge status: Home / Self Care | Payer: No Typology Code available for payment source | Attending: Obstetrics and Gynecology | Admitting: Obstetrics and Gynecology

## 2018-10-04 DIAGNOSIS — N841 Polyp of cervix uteri: Secondary | ICD-10-CM | POA: Diagnosis present

## 2018-10-04 DIAGNOSIS — D259 Leiomyoma of uterus, unspecified: Secondary | ICD-10-CM | POA: Diagnosis present

## 2018-10-04 DIAGNOSIS — N736 Female pelvic peritoneal adhesions (postinfective): Secondary | ICD-10-CM | POA: Diagnosis present

## 2018-10-04 DIAGNOSIS — D649 Anemia, unspecified: Secondary | ICD-10-CM | POA: Diagnosis present

## 2018-10-04 DIAGNOSIS — N801 Endometriosis of ovary: Secondary | ICD-10-CM | POA: Diagnosis present

## 2018-10-04 DIAGNOSIS — N939 Abnormal uterine and vaginal bleeding, unspecified: Secondary | ICD-10-CM | POA: Diagnosis present

## 2018-10-04 DIAGNOSIS — Z90710 Acquired absence of both cervix and uterus: Secondary | ICD-10-CM | POA: Diagnosis present

## 2018-10-04 HISTORY — PX: ABDOMINAL HYSTERECTOMY: SHX81

## 2018-10-04 HISTORY — PX: CYSTOSCOPY: SHX5120

## 2018-10-04 LAB — PREGNANCY, URINE: Preg Test, Ur: NEGATIVE

## 2018-10-04 SURGERY — HYSTERECTOMY, ABDOMINAL
Anesthesia: General

## 2018-10-04 MED ORDER — PROPOFOL 10 MG/ML IV BOLUS
INTRAVENOUS | Status: AC
  Filled 2018-10-04: qty 20

## 2018-10-04 MED ORDER — HYDROMORPHONE HCL 1 MG/ML IJ SOLN
0.2500 mg | INTRAMUSCULAR | Status: DC | PRN
  Administered 2018-10-04 (×3): 0.5 mg via INTRAVENOUS

## 2018-10-04 MED ORDER — DEXAMETHASONE SODIUM PHOSPHATE 10 MG/ML IJ SOLN
INTRAMUSCULAR | Status: DC | PRN
  Administered 2018-10-04: 10 mg via INTRAVENOUS

## 2018-10-04 MED ORDER — PROPOFOL 10 MG/ML IV BOLUS
INTRAVENOUS | Status: DC | PRN
  Administered 2018-10-04: 150 mg via INTRAVENOUS

## 2018-10-04 MED ORDER — KETOROLAC TROMETHAMINE 30 MG/ML IJ SOLN
15.0000 mg | Freq: Four times a day (QID) | INTRAMUSCULAR | Status: AC
  Administered 2018-10-04 – 2018-10-05 (×3): 15 mg via INTRAVENOUS
  Filled 2018-10-04 (×3): qty 1

## 2018-10-04 MED ORDER — SENNOSIDES-DOCUSATE SODIUM 8.6-50 MG PO TABS: 1.0000 | ORAL_TABLET | Freq: Every evening | ORAL | Status: DC | PRN

## 2018-10-04 MED ORDER — OXYCODONE HCL 5 MG PO TABS: 5.0000 mg | ORAL_TABLET | Freq: Once | ORAL | Status: DC | PRN

## 2018-10-04 MED ORDER — HYDROMORPHONE HCL 1 MG/ML IJ SOLN
0.2000 mg | INTRAMUSCULAR | Status: DC | PRN
  Administered 2018-10-04: 0.5 mg via INTRAVENOUS
  Administered 2018-10-04: 0.6 mg via INTRAVENOUS
  Filled 2018-10-04 (×2): qty 1

## 2018-10-04 MED ORDER — HYDROMORPHONE HCL 1 MG/ML IJ SOLN
INTRAMUSCULAR | Status: AC
  Filled 2018-10-04: qty 1

## 2018-10-04 MED ORDER — HYDROMORPHONE HCL 1 MG/ML IJ SOLN
INTRAMUSCULAR | Status: AC
  Administered 2018-10-04: 0.5 mg via INTRAVENOUS
  Filled 2018-10-04: qty 0.5

## 2018-10-04 MED ORDER — MIDAZOLAM HCL 2 MG/2ML IJ SOLN
INTRAMUSCULAR | Status: AC
  Filled 2018-10-04: qty 2

## 2018-10-04 MED ORDER — LIDOCAINE HCL (CARDIAC) PF 100 MG/5ML IV SOSY
PREFILLED_SYRINGE | INTRAVENOUS | Status: AC
  Filled 2018-10-04: qty 5

## 2018-10-04 MED ORDER — TERBUTALINE SULFATE 1 MG/ML IJ SOLN
INTRAMUSCULAR | Status: AC
  Filled 2018-10-04: qty 1

## 2018-10-04 MED ORDER — OXYCODONE HCL 5 MG/5ML PO SOLN: 5.0000 mg | Freq: Once | ORAL | Status: DC | PRN

## 2018-10-04 MED ORDER — LIDOCAINE HCL (CARDIAC) PF 100 MG/5ML IV SOSY
PREFILLED_SYRINGE | INTRAVENOUS | Status: DC | PRN
  Administered 2018-10-04: 100 mg via INTRAVENOUS

## 2018-10-04 MED ORDER — SCOPOLAMINE 1 MG/3DAYS TD PT72
1.0000 | MEDICATED_PATCH | Freq: Once | TRANSDERMAL | Status: DC
  Administered 2018-10-04: 1.5 mg via TRANSDERMAL

## 2018-10-04 MED ORDER — GLYCOPYRROLATE 0.2 MG/ML IJ SOLN
INTRAMUSCULAR | Status: DC | PRN
  Administered 2018-10-04: 0.1 mg via INTRAVENOUS

## 2018-10-04 MED ORDER — ROCURONIUM BROMIDE 100 MG/10ML IV SOLN
INTRAVENOUS | Status: DC | PRN
  Administered 2018-10-04: 10 mg via INTRAVENOUS
  Administered 2018-10-04: 5 mg via INTRAVENOUS
  Administered 2018-10-04: 50 mg via INTRAVENOUS

## 2018-10-04 MED ORDER — FENTANYL CITRATE (PF) 100 MCG/2ML IJ SOLN
INTRAMUSCULAR | Status: DC | PRN
  Administered 2018-10-04: 100 ug via INTRAVENOUS
  Administered 2018-10-04 (×5): 50 ug via INTRAVENOUS

## 2018-10-04 MED ORDER — ONDANSETRON HCL 4 MG/2ML IJ SOLN: 4.0000 mg | Freq: Four times a day (QID) | INTRAMUSCULAR | Status: DC | PRN

## 2018-10-04 MED ORDER — SIMETHICONE 80 MG PO CHEW: 80.0000 mg | CHEWABLE_TABLET | Freq: Four times a day (QID) | ORAL | Status: DC | PRN

## 2018-10-04 MED ORDER — METOCLOPRAMIDE HCL 5 MG/ML IJ SOLN
INTRAMUSCULAR | Status: AC
  Filled 2018-10-04: qty 2

## 2018-10-04 MED ORDER — ACETAMINOPHEN 10 MG/ML IV SOLN: 1000.0000 mg | Freq: Once | INTRAVENOUS | Status: DC | PRN

## 2018-10-04 MED ORDER — IBUPROFEN 800 MG PO TABS
800.0000 mg | ORAL_TABLET | Freq: Four times a day (QID) | ORAL | Status: DC
  Administered 2018-10-05 – 2018-10-06 (×3): 800 mg via ORAL
  Filled 2018-10-04 (×3): qty 1

## 2018-10-04 MED ORDER — PROMETHAZINE HCL 25 MG/ML IJ SOLN: 6.2500 mg | INTRAMUSCULAR | Status: DC | PRN

## 2018-10-04 MED ORDER — DEXAMETHASONE SODIUM PHOSPHATE 10 MG/ML IJ SOLN
INTRAMUSCULAR | Status: AC
  Filled 2018-10-04: qty 1

## 2018-10-04 MED ORDER — LACTATED RINGERS IV SOLN
INTRAVENOUS | Status: DC
  Administered 2018-10-04: 08:00:00 via INTRAVENOUS
  Administered 2018-10-04: 125 mL/h via INTRAVENOUS
  Administered 2018-10-04: 09:00:00 via INTRAVENOUS

## 2018-10-04 MED ORDER — MIDAZOLAM HCL 2 MG/2ML IJ SOLN
INTRAMUSCULAR | Status: DC | PRN
  Administered 2018-10-04: 1 mg via INTRAVENOUS

## 2018-10-04 MED ORDER — BUPIVACAINE HCL (PF) 0.5 % IJ SOLN
INTRAMUSCULAR | Status: AC
  Filled 2018-10-04: qty 30

## 2018-10-04 MED ORDER — ACETAMINOPHEN 500 MG PO TABS
1000.0000 mg | ORAL_TABLET | Freq: Four times a day (QID) | ORAL | Status: DC
  Administered 2018-10-04 – 2018-10-06 (×6): 1000 mg via ORAL
  Filled 2018-10-04 (×7): qty 2

## 2018-10-04 MED ORDER — FENTANYL CITRATE (PF) 100 MCG/2ML IJ SOLN
INTRAMUSCULAR | Status: AC
  Filled 2018-10-04: qty 2

## 2018-10-04 MED ORDER — METOCLOPRAMIDE HCL 5 MG/ML IJ SOLN
INTRAMUSCULAR | Status: DC | PRN
  Administered 2018-10-04: 10 mg via INTRAVENOUS

## 2018-10-04 MED ORDER — CEFAZOLIN SODIUM-DEXTROSE 2-4 GM/100ML-% IV SOLN
2.0000 g | INTRAVENOUS | Status: AC
  Administered 2018-10-04: 2 g via INTRAVENOUS

## 2018-10-04 MED ORDER — STERILE WATER FOR IRRIGATION IR SOLN
Status: DC | PRN
  Administered 2018-10-04: 1000 mL via INTRAVESICAL

## 2018-10-04 MED ORDER — ONDANSETRON HCL 4 MG PO TABS: 4.0000 mg | ORAL_TABLET | Freq: Four times a day (QID) | ORAL | Status: DC | PRN

## 2018-10-04 MED ORDER — 0.9 % SODIUM CHLORIDE (POUR BTL) OPTIME
TOPICAL | Status: DC | PRN
  Administered 2018-10-04: 2000 mL

## 2018-10-04 MED ORDER — CEFAZOLIN SODIUM-DEXTROSE 2-4 GM/100ML-% IV SOLN
INTRAVENOUS | Status: AC
  Filled 2018-10-04: qty 100

## 2018-10-04 MED ORDER — FERROUS SULFATE 325 (65 FE) MG PO TABS
325.0000 mg | ORAL_TABLET | Freq: Two times a day (BID) | ORAL | Status: DC
  Administered 2018-10-04 – 2018-10-06 (×4): 325 mg via ORAL
  Filled 2018-10-04 (×4): qty 1

## 2018-10-04 MED ORDER — FENTANYL CITRATE (PF) 250 MCG/5ML IJ SOLN
INTRAMUSCULAR | Status: AC
  Filled 2018-10-04: qty 5

## 2018-10-04 MED ORDER — OXYCODONE HCL 5 MG PO TABS
5.0000 mg | ORAL_TABLET | ORAL | Status: DC | PRN
  Administered 2018-10-05 – 2018-10-06 (×2): 5 mg via ORAL
  Filled 2018-10-04 (×2): qty 1

## 2018-10-04 MED ORDER — ROCURONIUM BROMIDE 100 MG/10ML IV SOLN
INTRAVENOUS | Status: AC
  Filled 2018-10-04: qty 1

## 2018-10-04 MED ORDER — SUGAMMADEX SODIUM 200 MG/2ML IV SOLN
INTRAVENOUS | Status: AC
  Filled 2018-10-04: qty 2

## 2018-10-04 MED ORDER — BUPIVACAINE HCL (PF) 0.5 % IJ SOLN
INTRAMUSCULAR | Status: DC | PRN
  Administered 2018-10-04: 10 mL

## 2018-10-04 MED ORDER — SCOPOLAMINE 1 MG/3DAYS TD PT72
MEDICATED_PATCH | TRANSDERMAL | Status: AC
  Administered 2018-10-04: 1.5 mg via TRANSDERMAL
  Filled 2018-10-04: qty 1

## 2018-10-04 MED ORDER — PANTOPRAZOLE SODIUM 40 MG PO TBEC
40.0000 mg | DELAYED_RELEASE_TABLET | Freq: Every day | ORAL | Status: DC
  Administered 2018-10-05 – 2018-10-06 (×2): 40 mg via ORAL
  Filled 2018-10-04 (×2): qty 1

## 2018-10-04 MED ORDER — SUGAMMADEX SODIUM 200 MG/2ML IV SOLN
INTRAVENOUS | Status: DC | PRN
  Administered 2018-10-04: 140 mg via INTRAVENOUS

## 2018-10-04 MED ORDER — ONDANSETRON HCL 4 MG/2ML IJ SOLN
INTRAMUSCULAR | Status: AC
  Filled 2018-10-04: qty 2

## 2018-10-04 MED ORDER — ONDANSETRON HCL 4 MG/2ML IJ SOLN
INTRAMUSCULAR | Status: DC | PRN
  Administered 2018-10-04: 4 mg via INTRAVENOUS

## 2018-10-04 MED ORDER — HYDROMORPHONE HCL 1 MG/ML IJ SOLN
INTRAMUSCULAR | Status: DC | PRN
  Administered 2018-10-04: 0.5 mg via INTRAVENOUS

## 2018-10-04 MED ORDER — HYDROMORPHONE HCL 1 MG/ML IJ SOLN
INTRAMUSCULAR | Status: AC
  Filled 2018-10-04: qty 0.5

## 2018-10-04 MED ORDER — DEXTROSE-NACL 5-0.45 % IV SOLN
125.0000 mL/h | INTRAVENOUS | Status: DC
  Administered 2018-10-04 – 2018-10-05 (×4): 125 mL/h via INTRAVENOUS

## 2018-10-04 MED ORDER — DOCUSATE SODIUM 100 MG PO CAPS
100.0000 mg | ORAL_CAPSULE | Freq: Two times a day (BID) | ORAL | Status: DC
  Administered 2018-10-04 – 2018-10-06 (×4): 100 mg via ORAL
  Filled 2018-10-04 (×4): qty 1

## 2018-10-04 MED ORDER — ALUM & MAG HYDROXIDE-SIMETH 200-200-20 MG/5ML PO SUSP: 30.0000 mL | ORAL | Status: DC | PRN

## 2018-10-04 SURGICAL SUPPLY — 41 items
ADH SKN CLS APL DERMABOND .7 (GAUZE/BANDAGES/DRESSINGS) ×2
APL SKNCLS STERI-STRIP NONHPOA (GAUZE/BANDAGES/DRESSINGS) ×2
BENZOIN TINCTURE PRP APPL 2/3 (GAUZE/BANDAGES/DRESSINGS) ×1 IMPLANT
CANISTER SUCT 3000ML PPV (MISCELLANEOUS) ×3 IMPLANT
DERMABOND ADVANCED (GAUZE/BANDAGES/DRESSINGS) ×1
DERMABOND ADVANCED .7 DNX12 (GAUZE/BANDAGES/DRESSINGS) ×2 IMPLANT
DRAPE WARM FLUID 44X44 (DRAPE) ×1 IMPLANT
DRSG OPSITE POSTOP 4X10 (GAUZE/BANDAGES/DRESSINGS) ×3 IMPLANT
DURAPREP 26ML APPLICATOR (WOUND CARE) ×3 IMPLANT
GAUZE 4X4 16PLY RFD (DISPOSABLE) ×1 IMPLANT
GAUZE SPONGE 4X4 12PLY STRL (GAUZE/BANDAGES/DRESSINGS) ×2 IMPLANT
GLOVE BIO SURGEON STRL SZ 6.5 (GLOVE) ×3 IMPLANT
GLOVE BIOGEL PI IND STRL 6.5 (GLOVE) ×2 IMPLANT
GLOVE BIOGEL PI IND STRL 7.0 (GLOVE) ×4 IMPLANT
GLOVE BIOGEL PI INDICATOR 6.5 (GLOVE) ×1
GLOVE BIOGEL PI INDICATOR 7.0 (GLOVE) ×2
GOWN STRL REUS W/TWL LRG LVL3 (GOWN DISPOSABLE) ×9 IMPLANT
HEMOSTAT ARISTA ABSORB 3G PWDR (HEMOSTASIS) ×1 IMPLANT
HIBICLENS CHG 4% 4OZ BTL (MISCELLANEOUS) ×3 IMPLANT
LIGASURE IMPACT 36 18CM CVD LR (INSTRUMENTS) IMPLANT
NDL HYPO 25X1 1.5 SAFETY (NEEDLE) IMPLANT
NEEDLE HYPO 25X1 1.5 SAFETY (NEEDLE) ×3 IMPLANT
NS IRRIG 1000ML POUR BTL (IV SOLUTION) ×3 IMPLANT
PACK ABDOMINAL GYN (CUSTOM PROCEDURE TRAY) ×3 IMPLANT
PAD ABD 8X7 1/2 STERILE (GAUZE/BANDAGES/DRESSINGS) ×2 IMPLANT
PAD OB MATERNITY 4.3X12.25 (PERSONAL CARE ITEMS) ×3 IMPLANT
SET CYSTO W/LG BORE CLAMP LF (SET/KITS/TRAYS/PACK) ×1 IMPLANT
SHEET LAVH (DRAPES) ×3 IMPLANT
SPONGE LAP 18X18 RF (DISPOSABLE) ×2 IMPLANT
SPONGE SURGIFOAM ABS GEL 12-7 (HEMOSTASIS) ×1 IMPLANT
STRIP CLOSURE SKIN 1/2X4 (GAUZE/BANDAGES/DRESSINGS) ×1 IMPLANT
SUT PLAIN 2 0 (SUTURE) ×3
SUT PLAIN ABS 2-0 CT1 27XMFL (SUTURE) ×2 IMPLANT
SUT VIC AB 0 CT1 18XCR BRD8 (SUTURE) ×4 IMPLANT
SUT VIC AB 0 CT1 36 (SUTURE) ×9 IMPLANT
SUT VIC AB 0 CT1 8-18 (SUTURE) ×9
SUT VICRYL 0 TIES 12 18 (SUTURE) ×3 IMPLANT
SUT VICRYL 4-0 PS2 18IN ABS (SUTURE) ×3 IMPLANT
SYR CONTROL 10ML LL (SYRINGE) ×1 IMPLANT
TOWEL OR 17X24 6PK STRL BLUE (TOWEL DISPOSABLE) ×6 IMPLANT
TRAY FOLEY W/BAG SLVR 14FR (SET/KITS/TRAYS/PACK) ×3 IMPLANT

## 2018-10-04 NOTE — Anesthesia Postprocedure Evaluation (Signed)
Anesthesia Post Note  Patient: Veronica Nichols  Procedure(s) Performed: TOTAL ABDOMINAL HYSTERECTOMY, BILATERAL SALPINGECTOMY, PELVIC WASHINGS, LEFT OVARIAN CYSTECTOMY CYSTOSCOPY (N/A )     Patient location during evaluation: PACU Anesthesia Type: General Level of consciousness: awake and alert Pain management: pain level controlled Vital Signs Assessment: post-procedure vital signs reviewed and stable Respiratory status: spontaneous breathing, nonlabored ventilation and respiratory function stable Cardiovascular status: blood pressure returned to baseline and stable Postop Assessment: no apparent nausea or vomiting Anesthetic complications: no    Last Vitals:  Vitals:   10/04/18 1115 10/04/18 1134  BP: 120/86 137/74  Pulse: 93 94  Resp: (!) 8 18  Temp:  36.8 C  SpO2: 100% 100%    Last Pain:  Vitals:   10/04/18 1134  TempSrc: Oral  PainSc:    Pain Goal: Patients Stated Pain Goal: 3 (10/04/18 0616)                 Brennan Bailey

## 2018-10-04 NOTE — Anesthesia Procedure Notes (Signed)
Procedure Name: Intubation Date/Time: 10/04/2018 7:31 AM Performed by: Flossie Dibble, CRNA Pre-anesthesia Checklist: Patient identified, Patient being monitored, Timeout performed, Emergency Drugs available and Suction available Patient Re-evaluated:Patient Re-evaluated prior to induction Oxygen Delivery Method: Circle System Utilized Preoxygenation: Pre-oxygenation with 100% oxygen Induction Type: IV induction Ventilation: Mask ventilation without difficulty and Oral airway inserted - appropriate to patient size Laryngoscope Size: Mac and 3 Grade View: Grade II Tube type: Oral Tube size: 7.0 mm Number of attempts: 1 Airway Equipment and Method: stylet Placement Confirmation: ETT inserted through vocal cords under direct vision,  positive ETCO2 and breath sounds checked- equal and bilateral Secured at: 20 cm Tube secured with: Tape Dental Injury: Teeth and Oropharynx as per pre-operative assessment

## 2018-10-04 NOTE — Brief Op Note (Signed)
10/04/2018  10:46 AM  PATIENT:  Veronica Nichols  53 y.o. female  PRE-OPERATIVE DIAGNOSIS:  AUB, fibroids, left ovarian mass  POST-OPERATIVE DIAGNOSIS:  AUB, fibroids, left ovarian mass  PROCEDURE:  Procedure(s): TOTAL ABDOMINAL HYSTERECTOMY, BILATERAL SALPINGECTOMY, PELVIC WASHINGS, LEFT OVARIAN CYSTECTOMY CYSTOSCOPY (N/A)  SURGEON:  Surgeon(s) and Role:    * Isolde Skaff, Colin Benton, MD - Primary    * Arvella Nigh, MD - Assisting  PHYSICIAN ASSISTANT:  McComb  ASSISTANTS: McComb   ANESTHESIA:   local, general and 10cc bupivicaine intraabdominal  EBL:  700 mL   BLOOD ADMINISTERED:none  DRAINS: Urinary Catheter (Foley)   LOCAL MEDICATIONS USED:  BUPIVICAINE  and Amount: 10 ml  SPECIMEN:  Source of Specimen:  uterus with cervix, bilateral fallopian tubes, portion of left ovarian cyst with cyst fluid, and pelvic washings  DISPOSITION OF SPECIMEN:  PATHOLOGY  COUNTS:  YES  TOURNIQUET:  * No tourniquets in log *  DICTATION: .Note written in EPIC  PLAN OF CARE: Admit to inpatient   PATIENT DISPOSITION:  PACU - hemodynamically stable.   Delay start of Pharmacological VTE agent (>24hrs) due to surgical blood loss or risk of bleeding: not applicable

## 2018-10-04 NOTE — Progress Notes (Signed)
Day of Surgery Procedure(s) (LRB): TOTAL ABDOMINAL HYSTERECTOMY, BILATERAL SALPINGECTOMY, PELVIC WASHINGS, LEFT OVARIAN CYSTECTOMY CYSTOSCOPY (N/A)  Subjective: Patient reports incisional pain.  That is fairly well controlled - currently 4/10. Tolerating clears.   Objective: I have reviewed patient's vital signs, intake and output and medications.  General: alert, cooperative and appears stated age Resp: normal percussion bilaterally Cardio: regular rate and rhythm, S1, S2 normal, no murmur, click, rub or gallop GI: soft, non-tender; bowel sounds normal; no masses,  no organomegaly and incision: clean, dry and intact Extremities: extremities normal, atraumatic, no cyanosis or edema Vaginal Bleeding: none  Assessment: s/p Procedure(s): TOTAL ABDOMINAL HYSTERECTOMY, BILATERAL SALPINGECTOMY, PELVIC WASHINGS, LEFT OVARIAN CYSTECTOMY CYSTOSCOPY (N/A): stable and progressing well  Plan: Advance diet Encourage ambulation  LOS: 0 days    Veronica Nichols 10/04/2018, 4:39 PM

## 2018-10-04 NOTE — Transfer of Care (Signed)
Immediate Anesthesia Transfer of Care Note  Patient: Veronica Nichols  Procedure(s) Performed: TOTAL ABDOMINAL HYSTERECTOMY, BILATERAL SALPINGECTOMY, PELVIC WASHINGS, LEFT OVARIAN CYSTECTOMY CYSTOSCOPY (N/A )  Patient Location: PACU  Anesthesia Type:General  Level of Consciousness: awake, alert  and oriented  Airway & Oxygen Therapy: Patient Spontanous Breathing and Patient connected to nasal cannula oxygen  Post-op Assessment: Report given to RN and Post -op Vital signs reviewed and stable  Post vital signs: Reviewed and stable  Last Vitals:  Vitals Value Taken Time  BP 138/82 10/04/2018  9:45 AM  Temp    Pulse 99 10/04/2018  9:48 AM  Resp 14 10/04/2018  9:48 AM  SpO2 98 % 10/04/2018  9:48 AM  Vitals shown include unvalidated device data.  Last Pain:  Vitals:   10/04/18 0616  TempSrc: Oral  PainSc: 1       Patients Stated Pain Goal: 3 (54/65/03 5465)  Complications: No apparent anesthesia complications

## 2018-10-04 NOTE — Op Note (Signed)
PREOPERATIVE DIAGNOSES: 1. Enlarged fibroid uterus. 2. Abnormal uterine bleeding. 3. Endometriosis 4. Ovarian cyst  POSTOPERATIVE DIAGNOSES: 1. Enlarged fibroid uterus. 2. Abnormal uterine bleeding. 3. Extensive pelvic adhesive disease 4. Endometrioma  5. Stage IV endometriosis 6. Obliterated culdesac  PROCEDURE PERFORMED: Total abdominal hysterectomy with bilateral salpingectomy, left ovarian cyst drainage and removal of cyst wall, extensive lysis of adhesions, pelvic washings, and cystoscopy  SURGEON: Dr. Lucillie Garfinkel ASSISTANT: Dr. Arvella Nigh  ANESTHESIA: General   ESTIMATED BLOOD LOSS: 700cc.  URINE OUTPUT: See anesthesia record  FLUIDS: See anesthesia record  COMPLICATIONS: Extensive adhesive disease  TUBES: None.  DRAINS: Foley to gravity.  PATHOLOGY: Uterus, cervix, bilateral fallopian tubes, portion of cyst wall attached to uterus, cyst fluid, and pelvic washings were sent to pathology for review.  FINDINGS: On exam, under anesthesia, normal appearing vulva and vagina, large ~4cm endocervical polyp, enlarged fibroid uterus fixed in the pelvis.  Operative findings showed extensive adhesive disease with the culdesac completely obliterated, large bowel and omentum adherent to posterior uterus, left ovary, and  left fallopian tube.   Procedure: The patient was prepped and draped in the usual sterile manner for an abdominal procedure. A pfannenstiel incision was made carried down to the underlying fascia. Fascia was incised in the midline and extended bilaterally with mayo scissors. Underlying rectus muscles were separated from the fascia superiorly and inferiorly in the usual fashion. Peritoneum was entered sharply with hemostat and metz with good visualization of the bladder and bowel. Once inside the abdominal cavity, a O Ninfa Linden retractor was placed. The uterus was then identified and grasped with upward traction but unable to be lifted out of the pelvis s/s  severe adhesive disease (see operative findings). The bowel was isolated and meticulously dissected off of the posterior uterus, left ovary and left fallopian tube with a combination of blunt and sharp dissection. During this dissection, the likely left sided endometrioma was ruptured and drained. A portion was adherent to posterior uterus and removed with specimen ultimately. After the bowel was completely released from the dense adhesions, the hysterectomy began.  The round ligaments on either side were identified and individually dissected and ligated with #0 Vicryl suture and divided. This allowed [place] to then create a bladder flap by both blunt and sharp dissection.  The fallopian tube and ovarian ligament were isolated through the broad ligament from the uterine body and ligated and then fixated with 0 vicryl and tied with a free tie as well.   We then skeletonized the uterine vessels on either side and carefully dissected the bladder flap anteriorly. Posteriorly, the peritoneum was dissected down toward the uterosacral ligaments. The retroperitoneal space was opened and bilateral ureters were palpated. Heaney clamps were then placed at each isthmic portion of the cervical body junction where the uterine arteries adjoined the uterus. These were clamped, ligated and divided using #0 Vicryl suture. The remainder of the uterus was then removed by the clamp-cut-ligation technique using #0 Vicryl on all major pedicles. With removal of the uterus, the vaginal cuff was closed with two haney stitches and then with serial figure of either stiches using #0 vicryl.  The bilateral ovaries were inspected and right ovary remained adherent to pelvic sidewall without evidence of cyst. Left ovary was without any further evidence of prior cyst. The bladder was carefully inspected and noted to be intact. The bowel in the area of dissection was extensively evaluated and no compromise was seen or felt.   Hemostasis was then  inspected  Arista placed over vaginal cuff with good hemostasis. At the base of the right IP adjacent to where the ureter crosses the IP, an area of oozing noted. Given location, decision made to not place stitch or use cautery. Thus, Gelfoam placed in the area of oozing with pressure held and hemostasis achieved after monitoring the area for several minutes.  Hemostasis was secured throughout the entire layers. The lap sponges were then removed and the self-retaining retractor was removed.  The Foley catheter was inspected and clear urine was noted A foley was removed. A cystoscope was inserted into the bladder. The bladder was intact and normal looking, with no evidence of trauma or perforation. Bilateral brisk ureteric jets were visualized. The bladder was drained through the sheath of the cystoscope, and then the cystoscope was removed. A foley catheter was then reinserted. The patient tolerated the operation nicely. There were no complications associated with this surgical procedure to this point. The sponge count was correct times 2 at this time. Having removed all instruments and packs and s/p cysto, we again inspected the pelvis for hemostasis and it remained good. We then began closure of the abdomen. The peritoneum was closed with #2-0 Vicryl in a running continuous manner. The fascia was closed with #0 Vicryl in a running continuous manner and the subcutaneous tissue was also closed with #2-0 Vicryl in a running continuous manner. Incision was closed with 4'0 vicryl in a continuous manner.  The patient tolerated the operation nicely and was then taken to the Recovery Room in good condition.    Lucillie Garfinkel MD

## 2018-10-04 NOTE — Anesthesia Postprocedure Evaluation (Signed)
Anesthesia Post Note  Patient: Veronica Nichols  Procedure(s) Performed: TOTAL ABDOMINAL HYSTERECTOMY, BILATERAL SALPINGECTOMY, PELVIC WASHINGS, LEFT OVARIAN CYSTECTOMY CYSTOSCOPY (N/A )     Patient location during evaluation: [facility] Anesthesia Type: General Level of consciousness: awake and alert Pain management: pain level controlled Vital Signs Assessment: post-procedure vital signs reviewed and stable Respiratory status: spontaneous breathing, nonlabored ventilation, respiratory function stable and patient connected to nasal cannula oxygen Cardiovascular status: blood pressure returned to baseline and stable Postop Assessment: no apparent nausea or vomiting Anesthetic complications: no    Last Vitals:  Vitals:   10/04/18 1134 10/04/18 1230  BP: 137/74 (!) 144/84  Pulse: 94 (!) 101  Resp: 18 18  Temp: 36.8 C 36.7 C  SpO2: 100% 99%    Last Pain:  Vitals:   10/04/18 1410  TempSrc:   PainSc: 5    Pain Goal: Patients Stated Pain Goal: 3 (10/04/18 1134)                 Alizzon Dioguardi

## 2018-10-04 NOTE — Addendum Note (Signed)
Addendum  created 10/04/18 1418 by Alvy Bimler, CRNA   Clinical Note Signed

## 2018-10-05 ENCOUNTER — Encounter (HOSPITAL_COMMUNITY): Payer: Self-pay | Admitting: Obstetrics and Gynecology

## 2018-10-05 LAB — CBC
HCT: 24.8 % — ABNORMAL LOW (ref 36.0–46.0)
Hemoglobin: 8.1 g/dL — ABNORMAL LOW (ref 12.0–15.0)
MCH: 32 pg (ref 26.0–34.0)
MCHC: 32.7 g/dL (ref 30.0–36.0)
MCV: 98 fL (ref 80.0–100.0)
Platelets: 222 10*3/uL (ref 150–400)
RBC: 2.53 MIL/uL — ABNORMAL LOW (ref 3.87–5.11)
RDW: 13.3 % (ref 11.5–15.5)
WBC: 11.2 10*3/uL — ABNORMAL HIGH (ref 4.0–10.5)

## 2018-10-05 NOTE — Progress Notes (Signed)
1 Day Post-Op Procedure(s) (LRB): TOTAL ABDOMINAL HYSTERECTOMY, BILATERAL SALPINGECTOMY, PELVIC WASHINGS, LEFT OVARIAN CYSTECTOMY CYSTOSCOPY (N/A)  Subjective: Patient reports + flatus.  Has tolerated clears and several packets of crackers. Her foley was removed and she has not yet voided.   Objective: I have reviewed patient's vital signs, intake and output, medications and labs.  General: alert, cooperative and appears stated age Resp: clear to auscultation bilaterally Cardio: regular rate and rhythm, S1, S2 normal, no murmur, click, rub or gallop GI: soft, non-tender; bowel sounds normal; no masses,  no organomegaly, incision: clean, dry, intact and pressure dressing removed and no strikethrough Extremities: extremities normal, atraumatic, no cyanosis or edema, Homans sign is negative, no sign of DVT and no edema, redness or tenderness in the calves or thighs Vaginal Bleeding: none  Assessment: s/p Procedure(s): TOTAL ABDOMINAL HYSTERECTOMY, BILATERAL SALPINGECTOMY, PELVIC WASHINGS, LEFT OVARIAN CYSTECTOMY CYSTOSCOPY (N/A): stable, progressing well and anemia  Plan: Advance diet Encourage ambulation Advance to PO medication Discontinue IV fluids Anemia - no s/s on exam, pt complaints (no complaints), or vitals. Cont iron. Exam benign.   LOS: 1 day    Tyson Dense 10/05/2018, 8:46 AM

## 2018-10-06 MED ORDER — INFLUENZA VAC SPLIT QUAD 0.5 ML IM SUSY
0.5000 mL | PREFILLED_SYRINGE | INTRAMUSCULAR | Status: AC | PRN
  Administered 2018-10-06: 0.5 mL via INTRAMUSCULAR

## 2018-10-06 MED ORDER — OXYCODONE HCL 5 MG PO TABS: 5.0000 mg | ORAL_TABLET | ORAL | 0 refills | Status: AC | PRN

## 2018-10-06 MED ORDER — DOCUSATE SODIUM 100 MG PO CAPS: 100.0000 mg | ORAL_CAPSULE | Freq: Two times a day (BID) | ORAL | 0 refills | Status: AC

## 2018-10-06 MED ORDER — IBUPROFEN 600 MG PO TABS: 600.0000 mg | ORAL_TABLET | Freq: Four times a day (QID) | ORAL | 0 refills | Status: AC

## 2018-10-06 NOTE — Progress Notes (Signed)
Pt discharged to home with husband.  Condition stable.  Discharge instructions reviewed with pt and husband together.  Pt to car via wheelchair with Astrid Divine, NT.  No equipment for home ordered at discharge.

## 2018-10-06 NOTE — Discharge Summary (Signed)
Physician Discharge Summary  Patient ID: Veronica Nichols MRN: [id number] DOB/AGE: 09-24-1965 53 y.o.  Admit date: 10/04/2018 Discharge date: 10/06/2018  Admission Diagnoses:  Discharge Diagnoses:  Active Problems:   Abnormal uterine bleeding (AUB)   S/P hysterectomy   Discharged Condition: good  Hospital Course: Pt is a 53yo admitted for scheduled above surgery. She Had a Total abdominal hysterectomy with bilateral salpingectomy, left ovarian cyst drainage and removal of cyst wall, extensive lysis of adhesions, pelvic washings, and cystoscopy with an EBL of 700cc She had an uncomplicated postoperative course and on POD#2 was meeting all goals such as ambulating, passing flatus, voiding freely, and tolerating a general diet and she was deemed stable for discharge home.    Consults: None  Significant Diagnostic Studies: labs:  CBC    Component Value Date/Time   WBC 11.2 (H) 10/05/2018 0522   RBC 2.53 (L) 10/05/2018 0522   HGB 8.1 (L) 10/05/2018 0522   HCT 24.8 (L) 10/05/2018 0522   PLT 222 10/05/2018 0522   MCV 98.0 10/05/2018 0522   MCH 32.0 10/05/2018 0522   MCHC 32.7 10/05/2018 0522   RDW 13.3 10/05/2018 0522     Treatments: surgery: Total abdominal hysterectomy with bilateral salpingectomy, left ovarian cyst drainage and removal of cyst wall, extensive lysis of adhesions, pelvic washings, and cystoscopy  Discharge Exam: Blood pressure 113/69, pulse 82, temperature 98.2 F (36.8 C), temperature source Oral, resp. rate 18, height 5\' 1"  (1.549 m), weight 71.7 kg, SpO2 98 %. General appearance: alert, cooperative and appears stated age Back: symmetric, no curvature. ROM normal. No CVA tenderness. Resp: clear to auscultation bilaterally Cardio: regular rate and rhythm, S1, S2 normal, no murmur, click, rub or gallop GI: soft, non-tender; bowel sounds normal; no masses,  no organomegaly and incision c/d/i Pelvic: external genitalia normal and no vaginal bleeding Extremities:  extremities normal, atraumatic, no cyanosis or edema, Homans sign is negative, no sign of DVT and no edema, redness or tenderness in the calves or thighs Skin: Skin color, texture, turgor normal. No rashes or lesions  Disposition: Discharge disposition: 01-Home or Self Care       Discharge Instructions    Call MD for:   Complete by:  As directed    Heavy vaginal bleeding or abnormal vaginal discharge   Call MD for:  difficulty breathing, headache or visual disturbances   Complete by:  As directed    Call MD for:  persistant nausea and vomiting   Complete by:  As directed    Call MD for:  redness, tenderness, or signs of infection (pain, swelling, redness, odor or green/yellow discharge around incision site)   Complete by:  As directed    Call MD for:  severe uncontrolled pain   Complete by:  As directed    Call MD for:  temperature >100.4   Complete by:  As directed    Diet general   Complete by:  As directed    Driving Restrictions   Complete by:  As directed    Do not drive until you are off narcotic pain medications and you feel like you can react in an emergency.   Increase activity slowly   Complete by:  As directed    Leave dressing on - Keep it clean, dry, and intact until clinic visit   Complete by:  As directed    Or can remove gently after 3 days   Lifting restrictions   Complete by:  As directed    Don't lift anything  more than 15-20 pounds   Sexual Activity Restrictions   Complete by:  As directed    Nothing in the vagina x 2 to 6 weeks. We will discuss at clinic visit.     Allergies as of 10/06/2018      Reactions   Sulfa Antibiotics Hives      Medication List    STOP taking these medications   norethindrone 5 MG tablet Commonly known as:  AYGESTIN     TAKE these medications   docusate sodium 100 MG capsule Commonly known as:  COLACE Take 1 capsule (100 mg total) by mouth 2 (two) times daily.   ferrous sulfate 325 (65 FE) MG tablet Take 325 mg  by mouth 2 (two) times daily.   ibuprofen 600 MG tablet Commonly known as:  ADVIL,MOTRIN Take 1 tablet (600 mg total) by mouth every 6 (six) hours for 8 days.   JUICE PLUS FIBRE PO Take 6 capsules by mouth daily. 2 fruit capsules, 2 vegetable capsules, 2 fish oil capsules   oxyCODONE 5 MG immediate release tablet Commonly known as:  Oxy IR/ROXICODONE Take 1-2 tablets (5-10 mg total) by mouth every 4 (four) hours as needed for moderate pain.   rosuvastatin 10 MG tablet Commonly known as:  CRESTOR Take 10 mg by mouth daily.   triamcinolone cream 0.1 % Commonly known as:  KENALOG Apply 1 application topically 2 (two) times daily.        Signed: Tyson Dense 10/06/2018, 8:47 AM

## 2019-06-28 ENCOUNTER — Other Ambulatory Visit: Payer: Self-pay

## 2019-06-28 DIAGNOSIS — Z20822 Contact with and (suspected) exposure to covid-19: Secondary | ICD-10-CM

## 2019-06-29 LAB — NOVEL CORONAVIRUS, NAA: SARS-CoV-2, NAA: NOT DETECTED

## 2019-08-01 ENCOUNTER — Other Ambulatory Visit: Payer: Self-pay

## 2019-08-01 DIAGNOSIS — Z20822 Contact with and (suspected) exposure to covid-19: Secondary | ICD-10-CM

## 2019-08-03 LAB — NOVEL CORONAVIRUS, NAA: SARS-CoV-2, NAA: NOT DETECTED
